# Patient Record
Sex: Male | Born: 1970 | Race: White | Hispanic: No | Marital: Married | Smoking: Never smoker
Health system: Southern US, Community
[De-identification: ages and names within clinical notes are randomized; demographics above are authoritative.]

## PROBLEM LIST (undated history)

## (undated) DIAGNOSIS — E7800 Pure hypercholesterolemia, unspecified: Secondary | ICD-10-CM

## (undated) DIAGNOSIS — Z8249 Family history of ischemic heart disease and other diseases of the circulatory system: Secondary | ICD-10-CM

## (undated) DIAGNOSIS — Z820 Family history of epilepsy and other diseases of the nervous system: Secondary | ICD-10-CM

## (undated) DIAGNOSIS — R079 Chest pain, unspecified: Secondary | ICD-10-CM

## (undated) DIAGNOSIS — Z0389 Encounter for observation for other suspected diseases and conditions ruled out: Secondary | ICD-10-CM

## (undated) DIAGNOSIS — I10 Essential (primary) hypertension: Secondary | ICD-10-CM

## (undated) DIAGNOSIS — R0602 Shortness of breath: Secondary | ICD-10-CM

## (undated) DIAGNOSIS — Z720 Tobacco use: Secondary | ICD-10-CM

## (undated) DIAGNOSIS — IMO0001 Reserved for inherently not codable concepts without codable children: Secondary | ICD-10-CM

## (undated) HISTORY — DX: Encounter for observation for other suspected diseases and conditions ruled out: Z03.89

## (undated) HISTORY — DX: Family history of epilepsy and other diseases of the nervous system: Z82.0

## (undated) HISTORY — DX: Family history of ischemic heart disease and other diseases of the circulatory system: Z82.49

## (undated) HISTORY — DX: Reserved for inherently not codable concepts without codable children: IMO0001

## (undated) HISTORY — DX: Shortness of breath: R06.02

## (undated) HISTORY — DX: Tobacco use: Z72.0

## (undated) HISTORY — DX: Chest pain, unspecified: R07.9

---

## 2001-05-08 ENCOUNTER — Emergency Department (HOSPITAL_COMMUNITY): Admission: EM | Admit: 2001-05-08 | Discharge: 2001-05-08 | Payer: Self-pay

## 2015-06-25 ENCOUNTER — Encounter (HOSPITAL_BASED_OUTPATIENT_CLINIC_OR_DEPARTMENT_OTHER): Payer: Self-pay

## 2015-06-25 ENCOUNTER — Emergency Department (HOSPITAL_BASED_OUTPATIENT_CLINIC_OR_DEPARTMENT_OTHER): Payer: 59

## 2015-06-25 ENCOUNTER — Emergency Department (HOSPITAL_BASED_OUTPATIENT_CLINIC_OR_DEPARTMENT_OTHER)
Admission: EM | Admit: 2015-06-25 | Discharge: 2015-06-25 | Disposition: A | Discharge status: Home / Self Care | Payer: 59 | Attending: Emergency Medicine | Admitting: Emergency Medicine

## 2015-06-25 DIAGNOSIS — R093 Abnormal sputum: Secondary | ICD-10-CM | POA: Insufficient documentation

## 2015-06-25 DIAGNOSIS — R0602 Shortness of breath: Secondary | ICD-10-CM

## 2015-06-25 DIAGNOSIS — Z79899 Other long term (current) drug therapy: Secondary | ICD-10-CM | POA: Diagnosis not present

## 2015-06-25 DIAGNOSIS — I10 Essential (primary) hypertension: Secondary | ICD-10-CM | POA: Insufficient documentation

## 2015-06-25 DIAGNOSIS — R52 Pain, unspecified: Secondary | ICD-10-CM

## 2015-06-25 DIAGNOSIS — E780 Pure hypercholesterolemia: Secondary | ICD-10-CM | POA: Diagnosis not present

## 2015-06-25 DIAGNOSIS — R079 Chest pain, unspecified: Secondary | ICD-10-CM | POA: Insufficient documentation

## 2015-06-25 HISTORY — DX: Pure hypercholesterolemia, unspecified: E78.00

## 2015-06-25 HISTORY — DX: Essential (primary) hypertension: I10

## 2015-06-25 LAB — I-STAT CHEM 8, ED
BUN: 14 mg/dL (ref 6–20)
CALCIUM ION: 1.18 mmol/L (ref 1.12–1.23)
Chloride: 103 mmol/L (ref 101–111)
Creatinine, Ser: 0.9 mg/dL (ref 0.61–1.24)
Glucose, Bld: 83 mg/dL (ref 65–99)
HCT: 53 % — ABNORMAL HIGH (ref 39.0–52.0)
HEMOGLOBIN: 18 g/dL — AB (ref 13.0–17.0)
POTASSIUM: 3.2 mmol/L — AB (ref 3.5–5.1)
Sodium: 140 mmol/L (ref 135–145)
TCO2: 20 mmol/L (ref 0–100)

## 2015-06-25 LAB — CBC
HCT: 50.7 % (ref 39.0–52.0)
Hemoglobin: 17.2 g/dL — ABNORMAL HIGH (ref 13.0–17.0)
MCH: 30.5 pg (ref 26.0–34.0)
MCHC: 33.9 g/dL (ref 30.0–36.0)
MCV: 89.9 fL (ref 78.0–100.0)
Platelets: 340 10*3/uL (ref 150–400)
RBC: 5.64 MIL/uL (ref 4.22–5.81)
RDW: 14.3 % (ref 11.5–15.5)
WBC: 12.2 10*3/uL — ABNORMAL HIGH (ref 4.0–10.5)

## 2015-06-25 LAB — TROPONIN I

## 2015-06-25 LAB — MAGNESIUM: Magnesium: 2.1 mg/dL (ref 1.7–2.4)

## 2015-06-25 MED ORDER — IOHEXOL 350 MG/ML SOLN
100.0000 mL | Freq: Once | INTRAVENOUS | Status: AC | PRN
  Administered 2015-06-25: 100 mL via INTRAVENOUS

## 2015-06-25 MED ORDER — ALBUTEROL SULFATE HFA 108 (90 BASE) MCG/ACT IN AERS: 1.0000 | INHALATION_SPRAY | Freq: Four times a day (QID) | RESPIRATORY_TRACT | Status: AC | PRN

## 2015-06-25 NOTE — ED Notes (Signed)
Patient here with chest tightness/burning that started last week and today reports that he feels as if he cant catch his breath. Had similar event 2 weeks and was admitted to [facility] and had negative stress test

## 2015-06-25 NOTE — Discharge Instructions (Signed)

## 2015-06-25 NOTE — ED Notes (Signed)
MD at bedside. 

## 2015-06-25 NOTE — ED Provider Notes (Signed)
CSN: [id number]     Arrival date & time 06/25/15  1232 History   First MD Initiated Contact with Patient 06/25/15 1246     Chief Complaint  Patient presents with  . Shortness of Breath     (Consider location/radiation/quality/duration/timing/severity/associated sxs/prior Treatment) Patient is a 44 y.o. male presenting with shortness of breath. The history is provided by the patient. No language interpreter was used.  Shortness of Breath Severity:  Moderate Duration:  2 weeks Timing:  Constant Progression:  Worsening Chronicity:  New Context: not emotional upset, not smoke exposure, not URI and not weather changes   Relieved by:  Nothing Worsened by:  Nothing tried Ineffective treatments:  None tried Associated symptoms: chest pain and sputum production   Associated symptoms: no hemoptysis   Risk factors: family hx of DVT   Risk factors: no recent alcohol use   Pt was [place] last week and had a stress echo due to shortness of breath and chest pain.   Pt would perfer to see MD in [place]  Past Medical History  Diagnosis Date  . High cholesterol   . Hypertension    History reviewed. No pertinent past surgical history. No family history on file. History  Substance Use Topics  . Smoking status: Never Smoker   . Smokeless tobacco: Not on file  . Alcohol Use: Not on file    Review of Systems  Respiratory: Positive for sputum production and shortness of breath. Negative for hemoptysis.   Cardiovascular: Positive for chest pain.  All other systems reviewed and are negative.     Allergies  Aspirin  Home Medications   Prior to Admission medications   Medication Sig Start Date End Date Taking? Authorizing Provider  atorvastatin (LIPITOR) 20 MG tablet Take 20 mg by mouth daily.   Yes Historical Provider, MD  lisinopril-hydrochlorothiazide (PRINZIDE,ZESTORETIC) 20-12.5 MG per tablet Take 1 tablet by mouth daily.   Yes Historical Provider, MD   BP  126/71 mmHg  Pulse 102  Temp(Src) 98 F (36.7 C) (Oral)  Resp 20  Ht  (1.778 m)  Wt 205 lb (92.987 kg)  BMI 29.41 kg/m2  SpO2 100% Physical Exam  Constitutional: He is oriented to person, place, and time. He appears well-developed and well-nourished.  HENT:  Head: Normocephalic.  Right Ear: External ear normal.  Left Ear: External ear normal.  Nose: Nose normal.  Mouth/Throat: Oropharynx is clear and moist.  Eyes: Conjunctivae and EOM are normal. Pupils are equal, round, and reactive to light.  Neck: Normal range of motion.  Cardiovascular: Normal heart sounds.   Pulmonary/Chest: Effort normal and breath sounds normal.  Abdominal: He exhibits no distension.  Musculoskeletal: Normal range of motion.  Neurological: He is alert and oriented to person, place, and time.  Skin: Skin is warm.  Psychiatric: He has a normal mood and affect.  Nursing note and vitals reviewed. EKG sinus tachycardia, normal qrs, normal st, no pr changes  ED Course  Procedures (including critical care time) Labs Review Labs Reviewed  CBC  COMPREHENSIVE METABOLIC PANEL  MAGNESIUM  TROPONIN I    Imaging Review Ct Angio Chest Pe W/cm &/or Wo Cm  06/25/2015   CLINICAL DATA:  Worsening shortness of breath. History of asthma. Concern for pulmonary embolism.  EXAM: CT ANGIOGRAPHY CHEST WITH CONTRAST  TECHNIQUE: Multidetector CT imaging of the chest was performed using the standard protocol during bolus administration of intravenous contrast. Multiplanar CT image reconstructions and MIPs were obtained to evaluate the  vascular anatomy.  CONTRAST:  OMNIPAQUE IOHEXOL 350 MG/ML SOLN  COMPARISON:  Radiograph 06/25/2015  FINDINGS: Mediastinum/Nodes: No filling defects within the pulmonary arteries to suggest acute pulmonary embolism. No acute findings aorta great vessels. No pericardial.  Esophagus normal. No mediastinal adenopathy. No supraclavicular adenopathy.  Lungs/Pleura: No pulmonary infarction. No  infiltrate or pleural fluid. No pneumothorax. Airways are normal.  Upper abdomen: Limited view of the liver, kidneys, pancreas are unremarkable. Normal adrenal glands. Postcholecystectomy.  Musculoskeletal: Mild degenerate spurring of the spine.  Review of the MIP images confirms the above findings.  IMPRESSION: 1. No acute pulmonary embolism. 2. No acute pulmonary parenchymal abnormality.   Electronically Signed   By: Genevive Bi M.D.   On: 06/25/2015 14:44   Dg Chest Portable 1 View  06/25/2015   CLINICAL DATA:  Shortness of breath, chest tightness. History of hypertension.  EXAM: PORTABLE CHEST - 1 VIEW  COMPARISON:  None.  FINDINGS: The heart size and mediastinal contours are within normal limits. Both lungs are clear. The visualized skeletal structures are unremarkable.  IMPRESSION: No active disease.   Electronically Signed   By: Charlett Nose M.D.   On: 06/25/2015 13:21     EKG Interpretation None      MDM  I spoke to Cardiology on call for [facility] ) who will arrange followup and have office call pt tomorrow.   Final diagnoses:  Pain  Shortness of breath        Elson Areas, PA-C 06/25/15 1640  Rolland Porter, MD 06/30/15 1623

## 2015-06-25 NOTE — ED Notes (Signed)
EDP discussed with patient the importance to follow up wit cardiology in the morning. Patient provided the cardiologist phone number.

## 2015-06-25 NOTE — ED Notes (Signed)
Patient return from CT

## 2015-06-25 NOTE — ED Notes (Signed)
Ambulated in dept. HR 82-85, SpO2 >98% on r/a, some mild SOB after placing back on monitor.

## 2015-06-25 NOTE — ED Provider Notes (Signed)
Pt seen and evaluated.  Reports CP for 2 days on Tue and wed c normal stress echo and serial enzymes at [facility] hospital.  Continues with DOE.  NO PND orthopnea, or edema. No pleuretic pain.  Smokes 1ppd x 20 years.  Father with hearet disease in late 35s.  EKG without ischemic changes, normal CTA, normal troponin.  Ambulates in ED with sats in high 90s.  Reports sx with ADLs at home.   Will discuss with cardiology.    Rolland Porter, MD 06/25/15 1524

## 2015-06-26 ENCOUNTER — Encounter: Payer: Self-pay | Admitting: Cardiovascular Disease

## 2015-06-26 ENCOUNTER — Ambulatory Visit (INDEPENDENT_AMBULATORY_CARE_PROVIDER_SITE_OTHER): Payer: 59 | Admitting: Cardiovascular Disease

## 2015-06-26 ENCOUNTER — Other Ambulatory Visit: Payer: Self-pay | Admitting: *Deleted

## 2015-06-26 VITALS — BP 128/92 | HR 78 | Ht 70.0 in | Wt 211.1 lb

## 2015-06-26 DIAGNOSIS — R0602 Shortness of breath: Secondary | ICD-10-CM

## 2015-06-26 DIAGNOSIS — I10 Essential (primary) hypertension: Secondary | ICD-10-CM | POA: Diagnosis not present

## 2015-06-26 DIAGNOSIS — Z01818 Encounter for other preprocedural examination: Secondary | ICD-10-CM

## 2015-06-26 DIAGNOSIS — R079 Chest pain, unspecified: Secondary | ICD-10-CM | POA: Insufficient documentation

## 2015-06-26 DIAGNOSIS — E785 Hyperlipidemia, unspecified: Secondary | ICD-10-CM | POA: Insufficient documentation

## 2015-06-26 DIAGNOSIS — D689 Coagulation defect, unspecified: Secondary | ICD-10-CM

## 2015-06-26 NOTE — Assessment & Plan Note (Signed)
History of hypertension blood pressure measurements at 120/92. He is on lisinopril and Hydrocort thiazide. Continued current meds at current dosing

## 2015-06-26 NOTE — Patient Instructions (Signed)

## 2015-06-26 NOTE — Assessment & Plan Note (Signed)
New-onset chest pain associated shortness of breath on 7/27. He was admitted to [facility] and ruled out for myocardial infarction. He had a stress echo which was apparently normal. He was discharged home and had shortness of breath off and on in the ensuing days and was seen at [facility] emergency room yesterday with worsening shortness of breath. Chest x-ray was apparently normal. A CT angiogram showed no evidence of pulmonary embolism. He does have a family history of heart disease as well as hypertension, hypovolemia and tobacco abuse. I'm concerned that his echo was a false negative. We will proceed with outpatient diagnostic coronary angiography via the right radial approach.The patient understands that risks included but are not limited to stroke (1 in 1000), death (1 in 1000), kidney failure [usually temporary] (1 in 500), bleeding (1 in 200), allergic reaction [possibly serious] (1 in 200). The patient understands and agrees to proceed

## 2015-06-26 NOTE — Progress Notes (Signed)
06/26/2015 JERAL ZICK   1971-01-30  [id number]  Primary Physician Pcp Not In System Primary Cardiologist: Runell Gess MD [name]   HPI:  Mr. Bob Taylor is a delightful 44 year old mildly overweight married Caucasian male with her children who works in the [employer] as a Location manager. He was referred by the emergency room for cardiovascular evaluation because of new onset chest pain and shortness of breath. His cardiac risk factor profile is notable for over 20 years of tobacco abuse currently smoking one pack per day though he is trying to stop, treated hypertension, hyperlipidemia and family history with father who had his first myocardial infarction and bypass surgery in his early 58s. He has never had a heart attack or stroke. He has had a cholecystectomy and has lumbar disc disease. He developed chest pain last week he was here [facility] where he ruled out for myocardial infarction. A stress echo was apparently normal. He had recurrent shortness of breath and some chest pain after discharge. He will see [facility] yesterday where again workup was negative including a CT angiogram ruling out pulmonary embolism.   Current Outpatient Prescriptions  Medication Sig Dispense Refill  . SUMAtriptan (IMITREX) 100 MG tablet Take 1 tablet by mouth as needed.    Marland Kitchen albuterol (PROVENTIL HFA;VENTOLIN HFA) 108 (90 BASE) MCG/ACT inhaler Inhale 1-2 puffs into the lungs every 6 (six) hours as needed for wheezing or shortness of breath. 1 Inhaler 0  . atorvastatin (LIPITOR) 20 MG tablet Take 20 mg by mouth daily.    Marland Kitchen lisinopril-hydrochlorothiazide (PRINZIDE,ZESTORETIC) 20-12.5 MG per tablet Take 1 tablet by mouth daily.     No current facility-administered medications for this visit.    Allergies  Allergen Reactions  . Aspirin Shortness Of Breath    History   Social History  . Marital Status: Single    Spouse Name: N/A  .  Number of Children: N/A  . Years of Education: N/A   Occupational History  . Not on file.   Social History Main Topics  . Smoking status: Never Smoker   . Smokeless tobacco: Not on file  . Alcohol Use: Not on file  . Drug Use: Not on file  . Sexual Activity: Not on file   Other Topics Concern  . Not on file   Social History Narrative     Review of Systems: General: negative for chills, fever, night sweats or weight changes.  Cardiovascular: negative for chest pain, dyspnea on exertion, edema, orthopnea, palpitations, paroxysmal nocturnal dyspnea or shortness of breath Dermatological: negative for rash Respiratory: negative for cough or wheezing Urologic: negative for hematuria Abdominal: negative for nausea, vomiting, diarrhea, bright red blood per rectum, melena, or hematemesis Neurologic: negative for visual changes, syncope, or dizziness All other systems reviewed and are otherwise negative except as noted above.    Blood pressure 128/92, pulse 78, height  (1.778 m), weight 211 lb 1.6 oz (95.754 kg).  General appearance: alert and no distress Neck: no adenopathy, no carotid bruit, no JVD, supple, symmetrical, trachea midline and thyroid not enlarged, symmetric, no tenderness/mass/nodules Lungs: clear to auscultation bilaterally Heart: regular rate and rhythm, S1, S2 normal, no murmur, click, rub or gallop Extremities: extremities normal, atraumatic, no cyanosis or edema  EKG not performed today  ASSESSMENT AND PLAN:   Hyperlipidemia History of hyperlipidemia on atorvastatin 20 mg a day followed by his PCP  Essential hypertension History of hypertension blood pressure measurements  at 120/92. He is on lisinopril and Hydrocort thiazide. Continued current meds at current dosing  Chest pain New-onset chest pain associated shortness of breath on 7/27. He was admitted to [facility] and ruled out for myocardial infarction. He had a stress echo which was  apparently normal. He was discharged home and had shortness of breath off and on in the ensuing days and was seen at [facility] emergency room yesterday with worsening shortness of breath. Chest x-ray was apparently normal. A CT angiogram showed no evidence of pulmonary embolism. He does have a family history of heart disease as well as hypertension, hypovolemia and tobacco abuse. I'm concerned that his echo was a false negative. We will proceed with outpatient diagnostic coronary angiography via the right radial approach.The patient understands that risks included but are not limited to stroke (1 in 1000), death (1 in 1000), kidney failure [usually temporary] (1 in 500), bleeding (1 in 200), allergic reaction [possibly serious] (1 in 200). The patient understands and agrees to proceed      Runell Gess MD [facility], [facility] 06/26/2015 2:07 PM

## 2015-06-26 NOTE — Assessment & Plan Note (Signed)
History of hyperlipidemia on atorvastatin 20 mg a day followed by his PCP 

## 2015-06-27 ENCOUNTER — Telehealth: Payer: Self-pay | Admitting: Cardiovascular Disease

## 2015-06-27 ENCOUNTER — Encounter: Payer: Self-pay | Admitting: *Deleted

## 2015-06-27 LAB — COMPLETE METABOLIC PANEL WITH GFR
ALBUMIN: 4.6 g/dL (ref 3.6–5.1)
ALK PHOS: 65 U/L (ref 40–115)
ALT: 9 U/L (ref 9–46)
AST: 14 U/L (ref 10–40)
BUN: 15 mg/dL (ref 7–25)
CALCIUM: 9.6 mg/dL (ref 8.6–10.3)
CO2: 27 mmol/L (ref 20–31)
Chloride: 102 mmol/L (ref 98–110)
Creat: 0.83 mg/dL (ref 0.60–1.35)
GFR, Est African American: >89 mL/min (ref >=60)
GFR, Est Non African American: >89 mL/min (ref >=60)
Glucose, Bld: 77 mg/dL (ref 65–99)
POTASSIUM: 4.5 mmol/L (ref 3.5–5.3)
Sodium: 139 mmol/L (ref 135–146)
TOTAL PROTEIN: 7 g/dL (ref 6.1–8.1)
Total Bilirubin: 0.5 mg/dL (ref 0.2–1.2)

## 2015-06-27 LAB — CBC
HCT: 46.3 % (ref 39.0–52.0)
Hemoglobin: 16 g/dL (ref 13.0–17.0)
MCH: 31 pg (ref 26.0–34.0)
MCHC: 34.6 g/dL (ref 30.0–36.0)
MCV: 89.7 fL (ref 78.0–100.0)
MPV: 9.1 fL (ref 8.6–12.4)
Platelets: 305 10*3/uL (ref 150–400)
RBC: 5.16 MIL/uL (ref 4.22–5.81)
RDW: 14.5 % (ref 11.5–15.5)
WBC: 8.7 10*3/uL (ref 4.0–10.5)

## 2015-06-27 LAB — TSH: TSH: 3.919 u[IU]/mL (ref 0.350–4.500)

## 2015-06-27 LAB — PROTIME-INR
INR: 0.99 (ref <1.50)
PROTHROMBIN TIME: 13.1 s (ref 11.6–15.2)

## 2015-06-27 LAB — APTT: APTT: 37 s (ref 24–37)

## 2015-06-27 NOTE — Telephone Encounter (Signed)
I spoke with patient and advised that we would be happy to help in any way with his papers, but we cannot attest to his actions prior to 06/26/15. I advised him that if he has disability papers, he would need to come by, sign a release, and there is a $25.00 charge.  He verbalized understanding.

## 2015-06-27 NOTE — Telephone Encounter (Signed)
Spoke with pt, questions regarding cath answered. Pt has been out of work since 06-20-15. He wanted to know if dr berry would fill out paperwork starting 06-20-15 or does he need someone else to fill those out. Will forward for dr berry's review

## 2015-06-27 NOTE — Telephone Encounter (Signed)
Bob Taylor, can you please address this regarding the patient's paperwork.

## 2015-06-27 NOTE — Telephone Encounter (Signed)
Pt called in wanting to speak with Dr. Hazle Coca nurse about the catheterization that he has coming up and if Dr.Berry would be willing to fill out some short term disability forms since he has been out of work this whole time. Please f/u with pt  Thanks

## 2015-06-29 ENCOUNTER — Encounter (HOSPITAL_COMMUNITY)
Admission: RE | Disposition: A | Discharge status: Home / Self Care | Payer: Self-pay | Source: Ambulatory Visit | Attending: Cardiovascular Disease

## 2015-06-29 ENCOUNTER — Ambulatory Visit (HOSPITAL_COMMUNITY)
Admission: RE | Admit: 2015-06-29 | Discharge: 2015-06-29 | Disposition: A | Discharge status: Home / Self Care | Payer: 59 | Source: Ambulatory Visit | Attending: Cardiovascular Disease | Admitting: Cardiovascular Disease

## 2015-06-29 ENCOUNTER — Telehealth: Payer: Self-pay | Admitting: Cardiovascular Disease

## 2015-06-29 DIAGNOSIS — Z6829 Body mass index (BMI) 29.0-29.9, adult: Secondary | ICD-10-CM | POA: Insufficient documentation

## 2015-06-29 DIAGNOSIS — R0789 Other chest pain: Secondary | ICD-10-CM | POA: Insufficient documentation

## 2015-06-29 DIAGNOSIS — E785 Hyperlipidemia, unspecified: Secondary | ICD-10-CM | POA: Diagnosis not present

## 2015-06-29 DIAGNOSIS — Z01818 Encounter for other preprocedural examination: Secondary | ICD-10-CM

## 2015-06-29 DIAGNOSIS — R0602 Shortness of breath: Secondary | ICD-10-CM | POA: Diagnosis not present

## 2015-06-29 DIAGNOSIS — R079 Chest pain, unspecified: Secondary | ICD-10-CM | POA: Diagnosis present

## 2015-06-29 DIAGNOSIS — E663 Overweight: Secondary | ICD-10-CM | POA: Insufficient documentation

## 2015-06-29 DIAGNOSIS — Z8249 Family history of ischemic heart disease and other diseases of the circulatory system: Secondary | ICD-10-CM | POA: Insufficient documentation

## 2015-06-29 DIAGNOSIS — I10 Essential (primary) hypertension: Secondary | ICD-10-CM | POA: Diagnosis not present

## 2015-06-29 DIAGNOSIS — F17210 Nicotine dependence, cigarettes, uncomplicated: Secondary | ICD-10-CM | POA: Diagnosis not present

## 2015-06-29 HISTORY — PX: CARDIAC CATHETERIZATION: SHX172

## 2015-06-29 SURGERY — LEFT HEART CATH AND CORONARY ANGIOGRAPHY
Anesthesia: LOCAL

## 2015-06-29 MED ORDER — SODIUM CHLORIDE 0.9 % IV SOLN: 250.0000 mL | INTRAVENOUS | Status: DC | PRN

## 2015-06-29 MED ORDER — SODIUM CHLORIDE 0.9 % IJ SOLN: 3.0000 mL | INTRAMUSCULAR | Status: DC | PRN

## 2015-06-29 MED ORDER — MIDAZOLAM HCL 2 MG/2ML IJ SOLN
INTRAMUSCULAR | Status: AC
  Filled 2015-06-29: qty 4

## 2015-06-29 MED ORDER — VERAPAMIL HCL 2.5 MG/ML IV SOLN
INTRAVENOUS | Status: AC
  Filled 2015-06-29: qty 2

## 2015-06-29 MED ORDER — SODIUM CHLORIDE 0.9 % IJ SOLN: 3.0000 mL | Freq: Two times a day (BID) | INTRAMUSCULAR | Status: DC

## 2015-06-29 MED ORDER — MORPHINE SULFATE 2 MG/ML IJ SOLN: 2.0000 mg | INTRAMUSCULAR | Status: DC | PRN

## 2015-06-29 MED ORDER — ONDANSETRON HCL 4 MG/2ML IJ SOLN: 4.0000 mg | Freq: Four times a day (QID) | INTRAMUSCULAR | Status: DC | PRN

## 2015-06-29 MED ORDER — FENTANYL CITRATE (PF) 100 MCG/2ML IJ SOLN
INTRAMUSCULAR | Status: DC | PRN
  Administered 2015-06-29: 25 ug via INTRAVENOUS

## 2015-06-29 MED ORDER — SODIUM CHLORIDE 0.9 % IV SOLN
INTRAVENOUS | Status: DC
  Administered 2015-06-29: 14:00:00 via INTRAVENOUS

## 2015-06-29 MED ORDER — HEPARIN (PORCINE) IN NACL 2-0.9 UNIT/ML-% IJ SOLN
INTRAMUSCULAR | Status: AC
  Filled 2015-06-29: qty 1000

## 2015-06-29 MED ORDER — MIDAZOLAM HCL 2 MG/2ML IJ SOLN
INTRAMUSCULAR | Status: DC | PRN
  Administered 2015-06-29: 1 mg via INTRAVENOUS

## 2015-06-29 MED ORDER — ASPIRIN 81 MG PO CHEW: 81.0000 mg | CHEWABLE_TABLET | ORAL | Status: DC

## 2015-06-29 MED ORDER — LIDOCAINE HCL (PF) 1 % IJ SOLN
INTRAMUSCULAR | Status: AC
  Filled 2015-06-29: qty 30

## 2015-06-29 MED ORDER — FENTANYL CITRATE (PF) 100 MCG/2ML IJ SOLN
INTRAMUSCULAR | Status: AC
  Filled 2015-06-29: qty 4

## 2015-06-29 MED ORDER — NITROGLYCERIN 1 MG/10 ML FOR IR/CATH LAB
INTRA_ARTERIAL | Status: DC | PRN
  Administered 2015-06-29: 17:00:00

## 2015-06-29 MED ORDER — HEPARIN SODIUM (PORCINE) 1000 UNIT/ML IJ SOLN
INTRAMUSCULAR | Status: AC
  Filled 2015-06-29: qty 1

## 2015-06-29 MED ORDER — NITROGLYCERIN 1 MG/10 ML FOR IR/CATH LAB
INTRA_ARTERIAL | Status: AC
  Filled 2015-06-29: qty 10

## 2015-06-29 MED ORDER — IOHEXOL 350 MG/ML SOLN
INTRAVENOUS | Status: DC | PRN
  Administered 2015-06-29: 45 mL via INTRACARDIAC

## 2015-06-29 MED ORDER — SODIUM CHLORIDE 0.9 % WEIGHT BASED INFUSION: 3.0000 mL/kg/h | INTRAVENOUS | Status: AC

## 2015-06-29 MED ORDER — ACETAMINOPHEN 325 MG PO TABS: 650.0000 mg | ORAL_TABLET | ORAL | Status: DC | PRN

## 2015-06-29 MED ORDER — ASPIRIN 81 MG PO CHEW
CHEWABLE_TABLET | ORAL | Status: AC
  Filled 2015-06-29: qty 1

## 2015-06-29 SURGICAL SUPPLY — 15 items
CATH INFINITI 5FR ANG PIGTAIL (CATHETERS) ×2 IMPLANT
CATH INFINITI 5FR MULTPACK ANG (CATHETERS) ×1 IMPLANT
CATH OPTITORQUE TIG 4.0 5F (CATHETERS) ×2 IMPLANT
DEVICE CLOSURE MYNXGRIP 5F (Vascular Products) ×1 IMPLANT
DEVICE RAD COMP TR BAND LRG (VASCULAR PRODUCTS) ×1 IMPLANT
GLIDESHEATH SLEND A-KIT 6F 22G (SHEATH) ×2 IMPLANT
KIT HEART LEFT (KITS) ×2 IMPLANT
PACK CARDIAC CATHETERIZATION (CUSTOM PROCEDURE TRAY) ×2 IMPLANT
SHEATH PINNACLE 5F 10CM (SHEATH) ×1 IMPLANT
SYR MEDRAD MARK V 150ML (SYRINGE) ×2 IMPLANT
TRANSDUCER W/STOPCOCK (MISCELLANEOUS) ×2 IMPLANT
TUBING CIL FLEX 10 FLL-RA (TUBING) ×2 IMPLANT
WIRE EMERALD 3MM-J .035X150CM (WIRE) ×1 IMPLANT
WIRE HI TORQ VERSACORE-J 145CM (WIRE) ×2 IMPLANT
WIRE SAFE-T 1.5MM-J .035X260CM (WIRE) ×2 IMPLANT

## 2015-06-29 NOTE — Discharge Instructions (Signed)
Angiogram, Care After °Refer to this sheet in the next few weeks. These instructions provide you with information on caring for yourself after your procedure. Your health care provider may also give you more specific instructions. Your treatment has been planned according to current medical practices, but problems sometimes occur. Call your health care provider if you have any problems or questions after your procedure.  °WHAT TO EXPECT AFTER THE PROCEDURE °After your procedure, it is typical to have the following sensations: °· Minor discomfort or tenderness and a small bump at the catheter insertion site. The bump should usually decrease in size and tenderness within 1 to 2 weeks. °· Any bruising will usually fade within 2 to 4 weeks. °HOME CARE INSTRUCTIONS  °· You may need to keep taking blood thinners if they were prescribed for you. Take medicines only as directed by your health care provider. °· Do not apply powder or lotion to the site. °· Do not take baths, swim, or use a hot tub until your health care provider approves. °· You may shower 24 hours after the procedure. Remove the bandage (dressing) and gently wash the site with plain soap and water. Gently pat the site dry. °· Inspect the site at least twice daily. °· Limit your activity for the first 48 hours. Do not bend, squat, or lift anything over 20 lb (9 kg) or as directed by your health care provider. °· Plan to have someone take you home after the procedure. Follow instructions about when you can drive or return to work. °SEEK MEDICAL CARE IF: °· You get light-headed when standing up. °· You have drainage (other than a small amount of blood on the dressing). °· You have chills. °· You have a fever. °· You have redness, warmth, swelling, or pain at the insertion site. °SEEK IMMEDIATE MEDICAL CARE IF:  °· You develop chest pain or shortness of breath, feel faint, or pass out. °· You have bleeding, swelling larger than a walnut, or drainage from the  catheter insertion site. °· You develop pain, discoloration, coldness, or severe bruising in the leg or arm that held the catheter. °· You develop bleeding from any other place, such as the bowels. You may see bright red blood in your urine or stools, or your stools may appear black and tarry. °· You have heavy bleeding from the site. If this happens, hold pressure on the site. °MAKE SURE YOU: °· Understand these instructions. °· Will watch your condition. °· Will get help right away if you are not doing well or get worse. °Document Released: 05/30/2005 Document Revised: 03/28/2014 Document Reviewed: 04/05/2013 °ExitCare® Patient Information ©2015 [employer]. This information is not intended to replace advice given to you by your health care provider. Make sure you discuss any questions you have with your health care provider. ° °Radial Site Care °Refer to this sheet in the next few weeks. These instructions provide you with information on caring for yourself after your procedure. Your caregiver may also give you more specific instructions. Your treatment has been planned according to current medical practices, but problems sometimes occur. Call your caregiver if you have any problems or questions after your procedure. °HOME CARE INSTRUCTIONS °· You may shower the day after the procedure. Remove the bandage (dressing) and gently wash the site with plain soap and water. Gently pat the site dry. °· Do not apply powder or lotion to the site. °· Do not submerge the affected site in water for 3 to 5 days. °·   Inspect the site at least twice daily. °· Do not flex or bend the affected arm for 24 hours. °· No lifting over 5 pounds (2.3 kg) for 5 days after your procedure. °· Do not drive home if you are discharged the same day of the procedure. Have someone else drive you. °· You may drive 24 hours after the procedure unless otherwise instructed by your caregiver. °· Do not operate machinery or power tools for 24  hours. °· A responsible adult should be with you for the first 24 hours after you arrive home. °What to expect: °· Any bruising will usually fade within 1 to 2 weeks. °· Blood that collects in the tissue (hematoma) may be painful to the touch. It should usually decrease in size and tenderness within 1 to 2 weeks. °SEEK IMMEDIATE MEDICAL CARE IF: °· You have unusual pain at the radial site. °· You have redness, warmth, swelling, or pain at the radial site. °· You have drainage (other than a small amount of blood on the dressing). °· You have chills. °· You have a fever or persistent symptoms for more than 72 hours. °· You have a fever and your symptoms suddenly get worse. °· Your arm becomes pale, cool, tingly, or numb. °· You have heavy bleeding from the site. Hold pressure on the site. °Document Released: 12/14/2010 Document Revised: 02/03/2012 Document Reviewed: 12/14/2010 °ExitCare® Patient Information ©2015 [employer]. This information is not intended to replace advice given to you by your health care provider. Make sure you discuss any questions you have with your health care provider. ° °

## 2015-06-29 NOTE — Telephone Encounter (Signed)
Kathlene November is calling to speak with a nurse about an allergy the pt has before filling an order that was sent through yesterday  thanks

## 2015-06-29 NOTE — Research (Signed)
CAD LAD Informed Consent   Subject Name: Bob Taylor  Subject met inclusion and exclusion criteria.  The informed consent form, study requirements and expectations were reviewed with the subject and questions and concerns were addressed prior to the signing of the consent form.  The subject verbalized understanding of the trail requirements.  The subject agreed to participate in the CAD LAD trial and signed the informed consent.  The informed consent was obtained prior to performance of any protocol-specific procedures for the subject.  A copy of the signed informed consent was given to the subject and a copy was placed in the subject's medical record.  Pamala Duffel 06/29/2015, 13:45 PM

## 2015-06-30 ENCOUNTER — Telehealth: Payer: Self-pay | Admitting: Cardiovascular Disease

## 2015-06-30 ENCOUNTER — Encounter (HOSPITAL_COMMUNITY): Payer: Self-pay | Admitting: Cardiovascular Disease

## 2015-06-30 NOTE — Telephone Encounter (Signed)
Closed encounter °

## 2015-07-03 ENCOUNTER — Telehealth: Payer: Self-pay | Admitting: Cardiovascular Disease

## 2015-07-03 NOTE — Telephone Encounter (Signed)
Pt called in stating that Dr. Allyson Sabal placed a cath on 8/4 and his discharge instructions were no lifting over 5 lbs for 5 days. He states that originally he was told that he could return to work on 8/9 but due to the instructions that would place him at 8/10. He would like to know if a new work note could be faxed to his job notifying them that he would be out of work an additional day. The number the note can be faxed to is [phone]. Please f/u with pt  Thanks

## 2015-07-03 NOTE — Telephone Encounter (Signed)
I spoke with patient and made him aware that the letter was drafted and forwarded per his request.

## 2015-07-07 ENCOUNTER — Telehealth: Payer: Self-pay | Admitting: Cardiovascular Disease

## 2015-07-07 NOTE — Telephone Encounter (Signed)
Has this been taken care of?

## 2015-07-07 NOTE — Telephone Encounter (Signed)
Received [employer] form in mail for Dr Allyson Sabal to complete and sign.  Sent to [employer] [facility] [employer] for processing.  Sent signed Berkley Harvey, pmt and [employer] disability form.  Sent via [employer] 07/07/15. lp

## 2015-07-10 NOTE — Telephone Encounter (Signed)
No recent refills from [facility] providers on file. No [facility] pharmacy on file and no contact number was provided when triage call came in.   Encounter closed.

## 2015-07-11 ENCOUNTER — Telehealth: Payer: Self-pay | Admitting: Cardiovascular Disease

## 2015-07-11 NOTE — Telephone Encounter (Signed)
Received [employer] Attending Physician Statement back from [employer] for Dr Allyson Sabal to review, complete and sign.  Given to [employer] for Dr Allyson Sabal to sign. lp

## 2015-07-19 ENCOUNTER — Telehealth: Payer: Self-pay | Admitting: Cardiovascular Disease

## 2015-07-19 ENCOUNTER — Telehealth: Payer: Self-pay | Admitting: *Deleted

## 2015-07-19 NOTE — Telephone Encounter (Signed)
Attending physician disability statement form completed and handed to [facility]

## 2015-07-19 NOTE — Telephone Encounter (Signed)
Received [employer] Attending Physician Statement completed and signed by Dr Allyson Sabal.  Faxed to [employer] and notified patient that forms completed and signed. lp

## 2015-07-25 ENCOUNTER — Ambulatory Visit (INDEPENDENT_AMBULATORY_CARE_PROVIDER_SITE_OTHER): Payer: 59 | Admitting: Cardiovascular Disease

## 2015-07-25 ENCOUNTER — Encounter: Payer: Self-pay | Admitting: Cardiovascular Disease

## 2015-07-25 VITALS — BP 138/80 | HR 73 | Ht 70.0 in | Wt 207.9 lb

## 2015-07-25 DIAGNOSIS — E785 Hyperlipidemia, unspecified: Secondary | ICD-10-CM | POA: Diagnosis not present

## 2015-07-25 DIAGNOSIS — R079 Chest pain, unspecified: Secondary | ICD-10-CM | POA: Diagnosis not present

## 2015-07-25 DIAGNOSIS — I10 Essential (primary) hypertension: Secondary | ICD-10-CM

## 2015-07-25 NOTE — Assessment & Plan Note (Signed)
History of hypertension blood pressure measures 138/80. He is on lisinopril and hydrochlorothiazide. Continue current meds at current dosing

## 2015-07-25 NOTE — Progress Notes (Signed)
07/25/2015 Bob Taylor   1971-11-16  [id number]  Primary Physician Pcp Not In System Primary Cardiologist: Runell Gess MD [name]   HPI:  Mr. Bob Taylor is a delightful 44 year old mildly overweight married Caucasian male with her children who works in the [employer] as a Location manager. He was referred by the emergency room for cardiovascular evaluation because of new onset chest pain and shortness of breath. His cardiac risk factor profile is notable for over 20 years of tobacco abuse currently smoking one pack per day though he is trying to stop, treated hypertension, hyperlipidemia and family history with father who had his first myocardial infarction and bypass surgery in his early 29s. He has never had a heart attack or stroke. He has had a cholecystectomy and has lumbar disc disease. He developed chest pain last week he was here [facility] where he ruled out for myocardial infarction. A stress echo was apparently normal. He had recurrent shortness of breath and some chest pain after discharge. He will see [facility] yesterday where again workup was negative including a CT angiogram ruling out pulmonary embolism. After a long discussion in the office we decided to proceed with outpatient diagnostic arteriography to rule out coronary artery disease and potential false positive stress echocardiogram. This was performed on 06/29/15 revealing normal coronary arteries and normal LV function suggesting that his chest pain was noncardiac.   Current Outpatient Prescriptions  Medication Sig Dispense Refill  . albuterol (PROVENTIL HFA;VENTOLIN HFA) 108 (90 BASE) MCG/ACT inhaler Inhale 1-2 puffs into the lungs every 6 (six) hours as needed for wheezing or shortness of breath. 1 Inhaler 0  . atorvastatin (LIPITOR) 80 MG tablet Take 80 mg by mouth daily.    Marland Kitchen FLUoxetine (PROZAC) 20 MG capsule Take 20 mg by mouth daily.    Marland Kitchen  lisinopril-hydrochlorothiazide (PRINZIDE,ZESTORETIC) 20-12.5 MG per tablet Take 1 tablet by mouth daily.    Marland Kitchen omeprazole (PRILOSEC) 20 MG capsule Take 1 capsule by mouth daily.  1  . SUMAtriptan (IMITREX) 100 MG tablet Take 1 tablet by mouth as needed for migraine or headache.      No current facility-administered medications for this visit.    Allergies  Allergen Reactions  . Aspirin Shortness Of Breath    Social History   Social History  . Marital Status: Married    Spouse Name: N/A  . Number of Children: N/A  . Years of Education: N/A   Occupational History  . Not on file.   Social History Main Topics  . Smoking status: Never Smoker   . Smokeless tobacco: Not on file  . Alcohol Use: Not on file  . Drug Use: Not on file  . Sexual Activity: Not on file   Other Topics Concern  . Not on file   Social History Narrative     Review of Systems: General: negative for chills, fever, night sweats or weight changes.  Cardiovascular: negative for chest pain, dyspnea on exertion, edema, orthopnea, palpitations, paroxysmal nocturnal dyspnea or shortness of breath Dermatological: negative for rash Respiratory: negative for cough or wheezing Urologic: negative for hematuria Abdominal: negative for nausea, vomiting, diarrhea, bright red blood per rectum, melena, or hematemesis Neurologic: negative for visual changes, syncope, or dizziness All other systems reviewed and are otherwise negative except as noted above.    Blood pressure 138/80, pulse 73, height  (1.778 m), weight 207 lb 14.4 oz (94.303 kg).  General appearance: alert  and no distress Neck: no adenopathy, no carotid bruit, no JVD, supple, symmetrical, trachea midline and thyroid not enlarged, symmetric, no tenderness/mass/nodules Lungs: clear to auscultation bilaterally Heart: regular rate and rhythm, S1, S2 normal, no murmur, click, rub or gallop Extremities: extremities normal, atraumatic, no cyanosis or  edema  EKG not performed today  ASSESSMENT AND PLAN:   Hyperlipidemia History of hyperlipidemia on atorvastatin 80 mg today followed by his PCP  Essential hypertension History of hypertension blood pressure measures 138/80. He is on lisinopril and hydrochlorothiazide. Continue current meds at current dosing  Chest pain History of chest pain with recent outpatient for a catheterization performed 06/29/15 revealing normal coronary arteries and normal LV function suggesting that his pain was noncardiac      Runell Gess MD [facility], [facility] 07/25/2015 3:58 PM

## 2015-07-25 NOTE — Assessment & Plan Note (Signed)
History of hyperlipidemia on atorvastatin 80 mg today followed by his PCP 

## 2015-07-25 NOTE — Patient Instructions (Signed)
Your physician wants you to follow-up in: 1 year with Dr Berry. You will receive a reminder letter in the mail two months in advance. If you don't receive a letter, please call our office to schedule the follow-up appointment.  

## 2015-07-25 NOTE — Assessment & Plan Note (Signed)
History of chest pain with recent outpatient for a catheterization performed 06/29/15 revealing normal coronary arteries and normal LV function suggesting that his pain was noncardiac

## 2016-06-20 IMAGING — CT CT ANGIO CHEST
2 of 6 series · 19 of 36 positions shown · IV contrast (APPLIED)
Comparison: Radiograph 06/25/2015

CLINICAL DATA: Worsening shortness of breath. History of asthma.
Concern for pulmonary embolism.

EXAM:
CT ANGIOGRAPHY CHEST WITH CONTRAST
TECHNIQUE: Multidetector CT imaging of the chest was performed using the
standard protocol during bolus administration of intravenous
contrast. Multiplanar CT image reconstructions and MIPs were
obtained to evaluate the vascular anatomy.
CONTRAST:  100mL OMNIPAQUE IOHEXOL 350 MG/ML SOLN

[Series 5: pe 1.0 b26f · axial · 0.71mm/px · z∈[-335,-58]mm · 18 of 309 slices shown]
[im 16/309  lung]
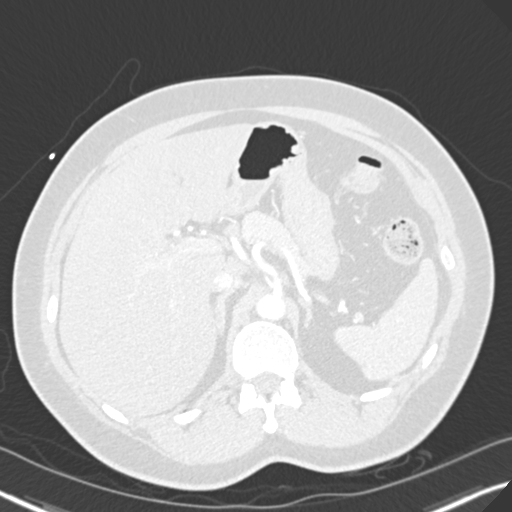
[im 31/309  mediastinal]
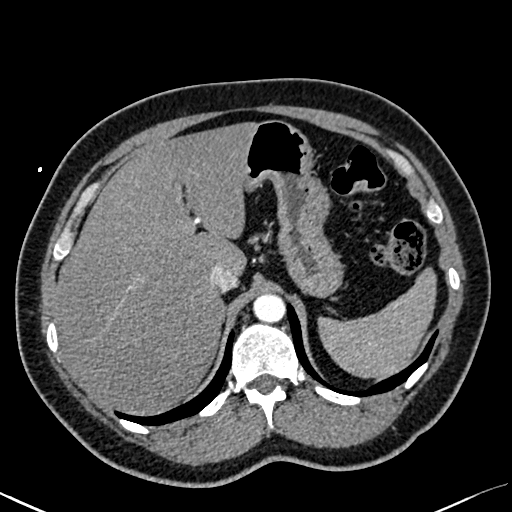
[im 47/309  lung]
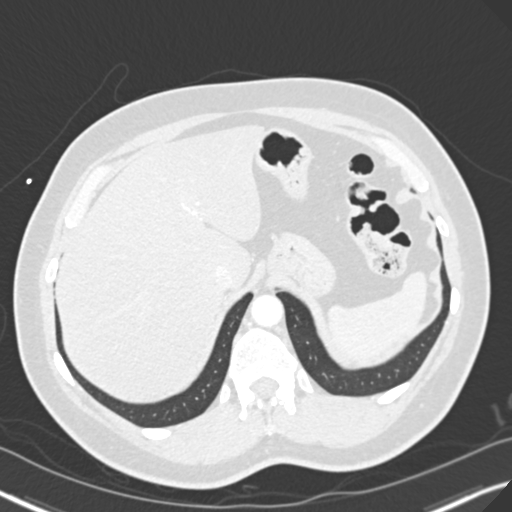
[im 62/309  mediastinal]
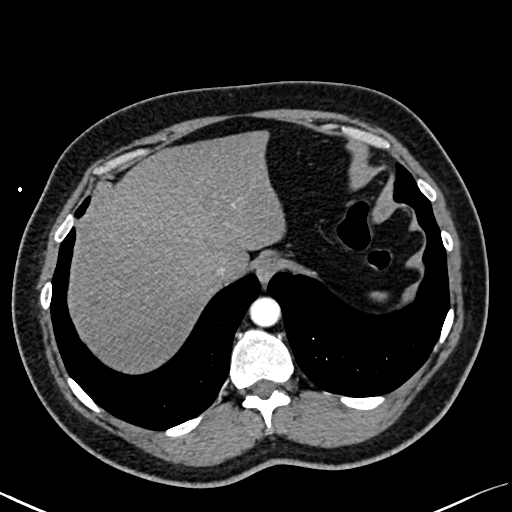
[im 78/309  lung]
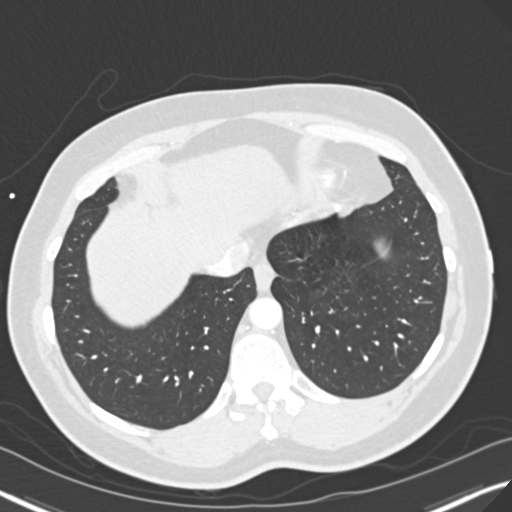
[im 93/309  mediastinal]
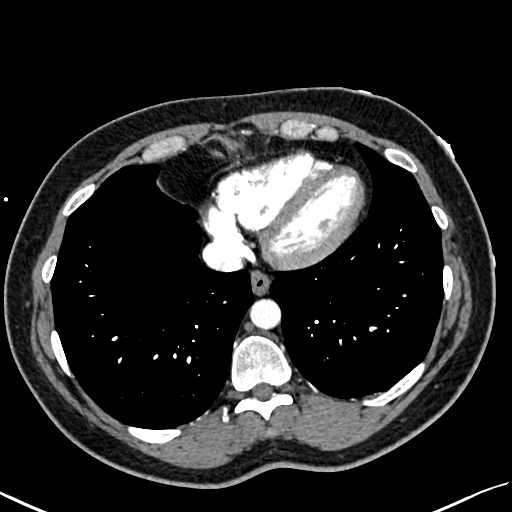
[im 108/309  lung]
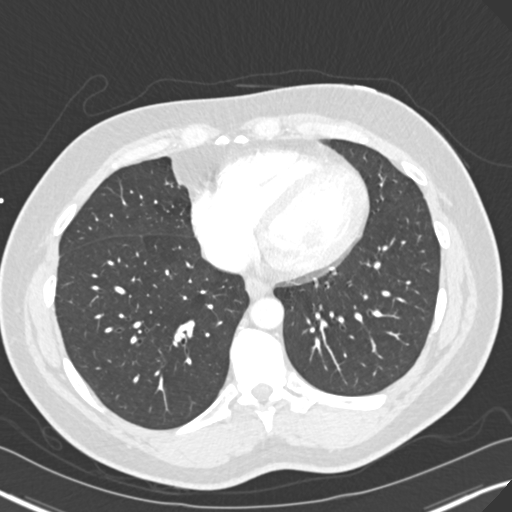
[im 124/309  mediastinal]
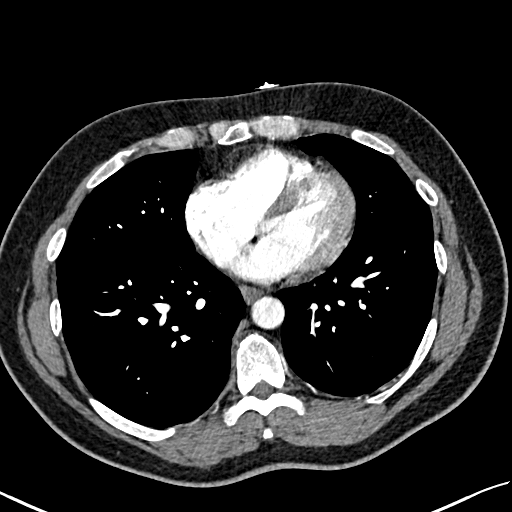
[im 139/309  lung]
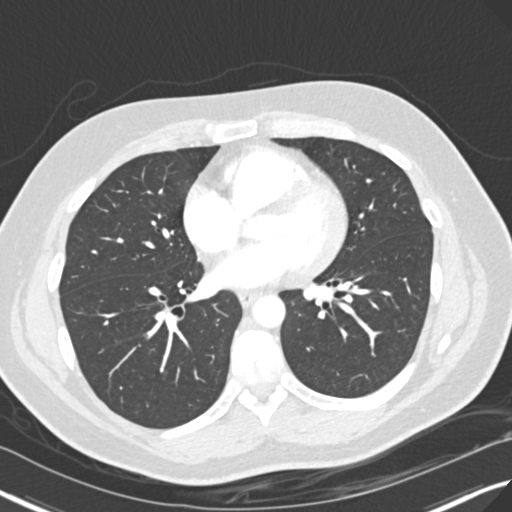
[im 170/309  mediastinal]
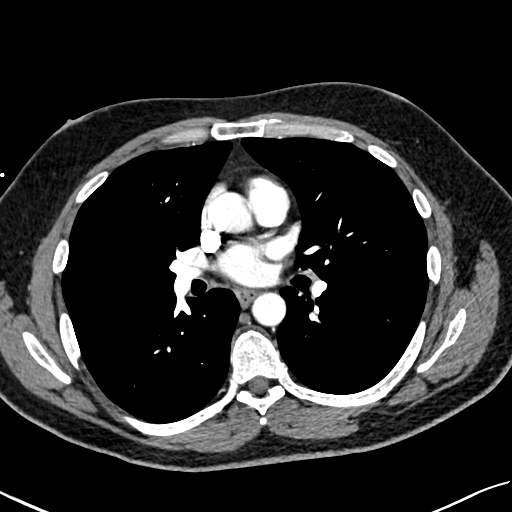
[im 185/309  lung]
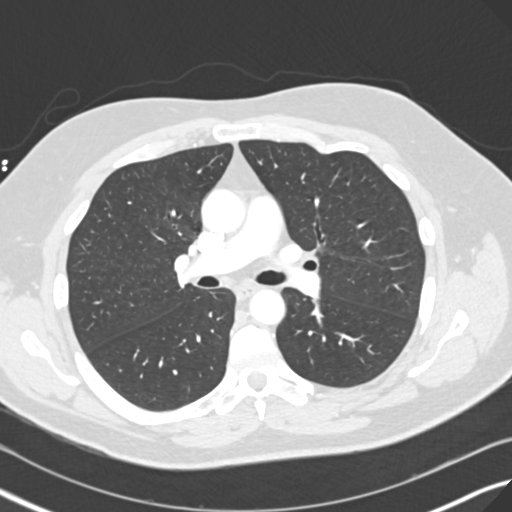
[im 201/309  mediastinal]
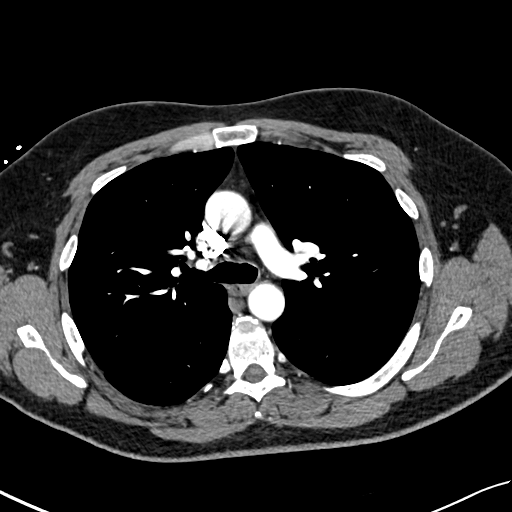
[im 216/309  lung]
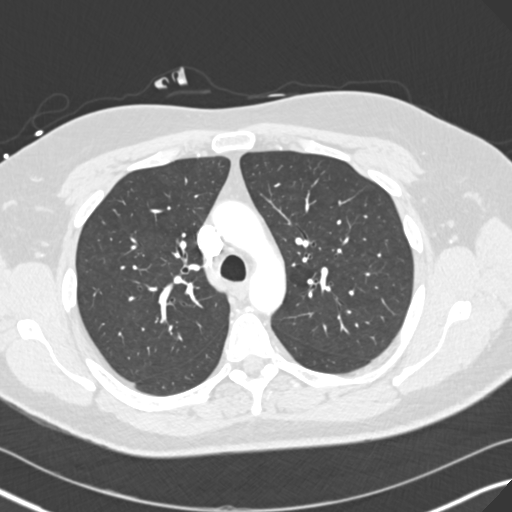
[im 232/309  mediastinal]
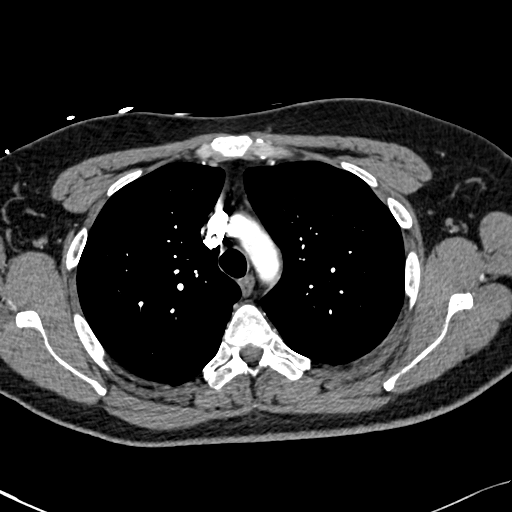
[im 247/309  lung]
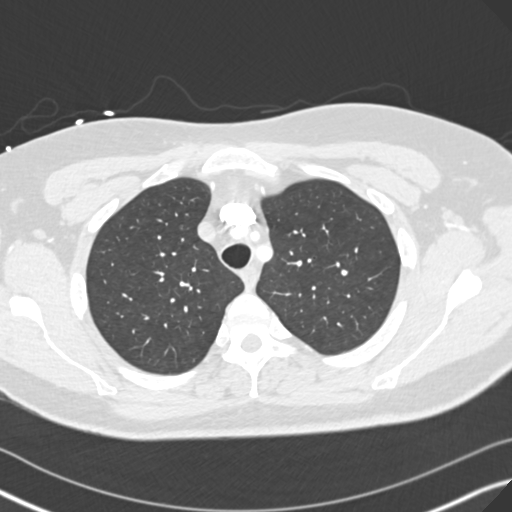
[im 262/309  mediastinal]
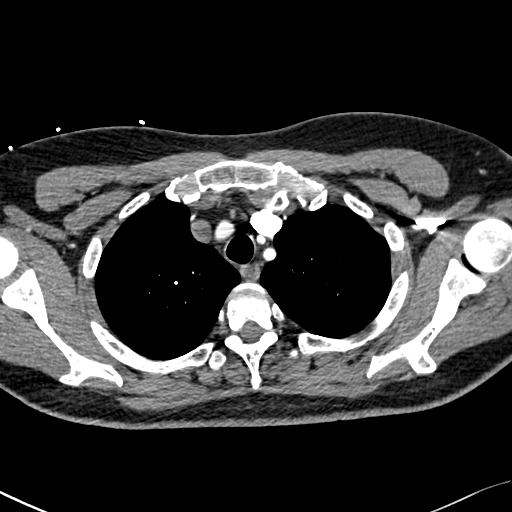
[im 278/309  lung]
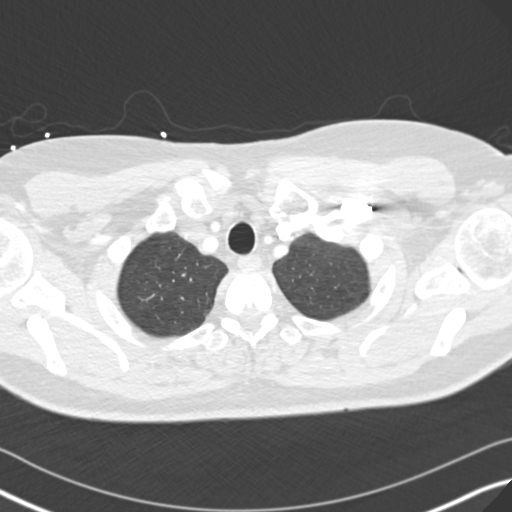
[im 293/309  mediastinal]
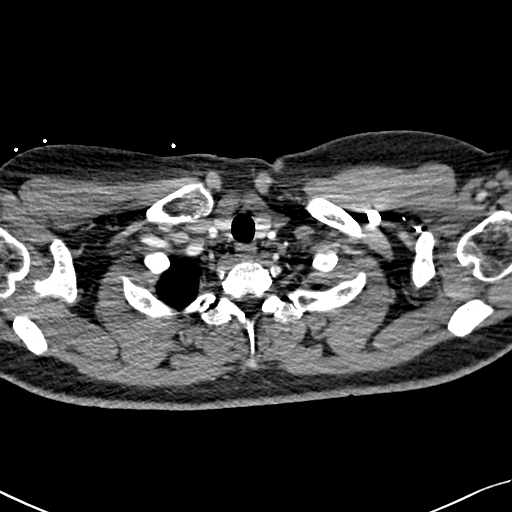

[Series 8: pe 2.0 coronal · coronal · 0.64mm/px · 1 of 127 slices shown]
[im 64/127  mediastinal]
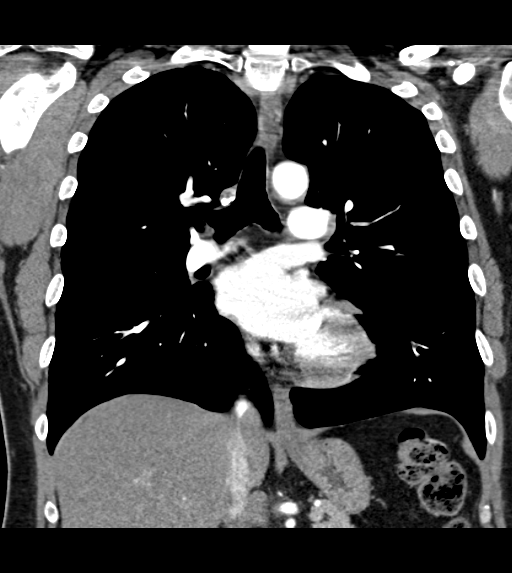

[19 of 36 positions shown; findings below may reference images not displayed]

FINDINGS: Mediastinum/Nodes: No filling defects within the pulmonary arteries
to suggest acute pulmonary embolism. No acute findings aorta great
vessels. No pericardial.

Esophagus normal. No mediastinal adenopathy. No supraclavicular
adenopathy.

Lungs/Pleura: No pulmonary infarction. No infiltrate or pleural
fluid. No pneumothorax. Airways are normal.

Upper abdomen: Limited view of the liver, kidneys, pancreas are
unremarkable. Normal adrenal glands. Postcholecystectomy.

Musculoskeletal: Mild degenerate spurring of the spine.

Review of the MIP images confirms the above findings.
IMPRESSION: 1. No acute pulmonary embolism.
2. No acute pulmonary parenchymal abnormality.
